# Patient Record
Sex: Female | Born: 1950 | Race: White | Hispanic: No
Health system: Southern US, Community
[De-identification: ages and names within clinical notes are randomized; demographics above are authoritative.]

---

## 2000-09-09 ENCOUNTER — Encounter: Payer: Self-pay | Admitting: Orthopaedic Surgery

## 2000-09-09 ENCOUNTER — Ambulatory Visit (HOSPITAL_COMMUNITY): Admission: RE | Admit: 2000-09-09 | Discharge: 2000-09-09 | Payer: Self-pay | Admitting: Orthopaedic Surgery

## 2000-11-22 ENCOUNTER — Encounter: Payer: Self-pay | Admitting: Orthopaedic Surgery

## 2000-11-28 ENCOUNTER — Encounter: Payer: Self-pay | Admitting: Orthopaedic Surgery

## 2000-11-28 ENCOUNTER — Inpatient Hospital Stay (HOSPITAL_COMMUNITY): Admission: RE | Admit: 2000-11-28 | Discharge: 2000-12-02 | Payer: Self-pay | Admitting: Orthopaedic Surgery

## 2004-01-20 ENCOUNTER — Ambulatory Visit: Payer: Self-pay | Admitting: Family Medicine

## 2004-05-22 ENCOUNTER — Ambulatory Visit (HOSPITAL_COMMUNITY): Admission: RE | Admit: 2004-05-22 | Discharge: 2004-05-22 | Payer: Self-pay | Admitting: Orthopaedic Surgery

## 2005-01-25 ENCOUNTER — Ambulatory Visit: Payer: Self-pay | Admitting: General Practice

## 2005-03-15 ENCOUNTER — Encounter: Admission: RE | Admit: 2005-03-15 | Discharge: 2005-03-15 | Payer: Self-pay | Admitting: Orthopaedic Surgery

## 2005-05-05 ENCOUNTER — Inpatient Hospital Stay (HOSPITAL_COMMUNITY): Admission: RE | Admit: 2005-05-05 | Discharge: 2005-05-09 | Payer: Self-pay | Admitting: Orthopaedic Surgery

## 2007-08-02 IMAGING — CR DG HIP (WITH OR WITHOUT PELVIS) 2-3V*L*
2 series · 2 of 2 positions shown · non-contrast
Comparison: None.

CLINICAL DATA: Left total hip arthroplasty yesterday.

PORTABLE LEFT HIP - 2 VIEW  [DATE]/2330 1115 hours:

[view not recorded (1 of 2)]
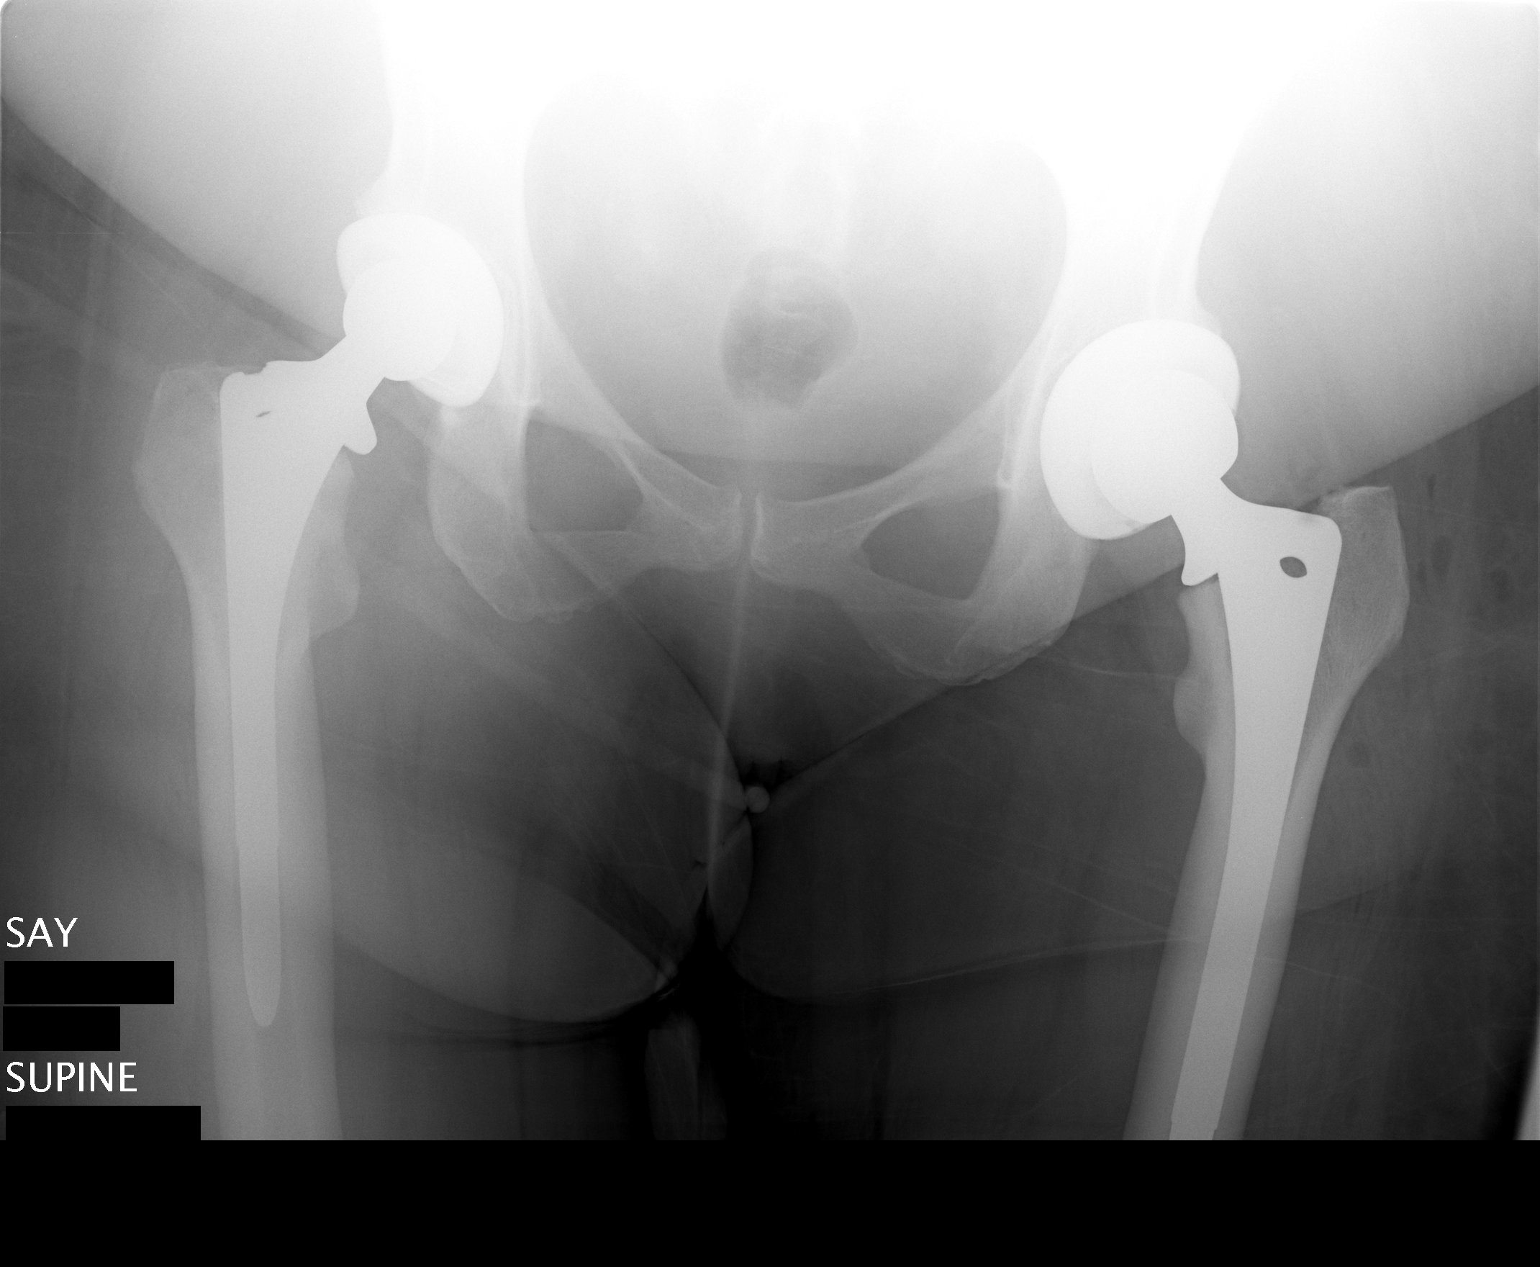

[view not recorded (2 of 2)]
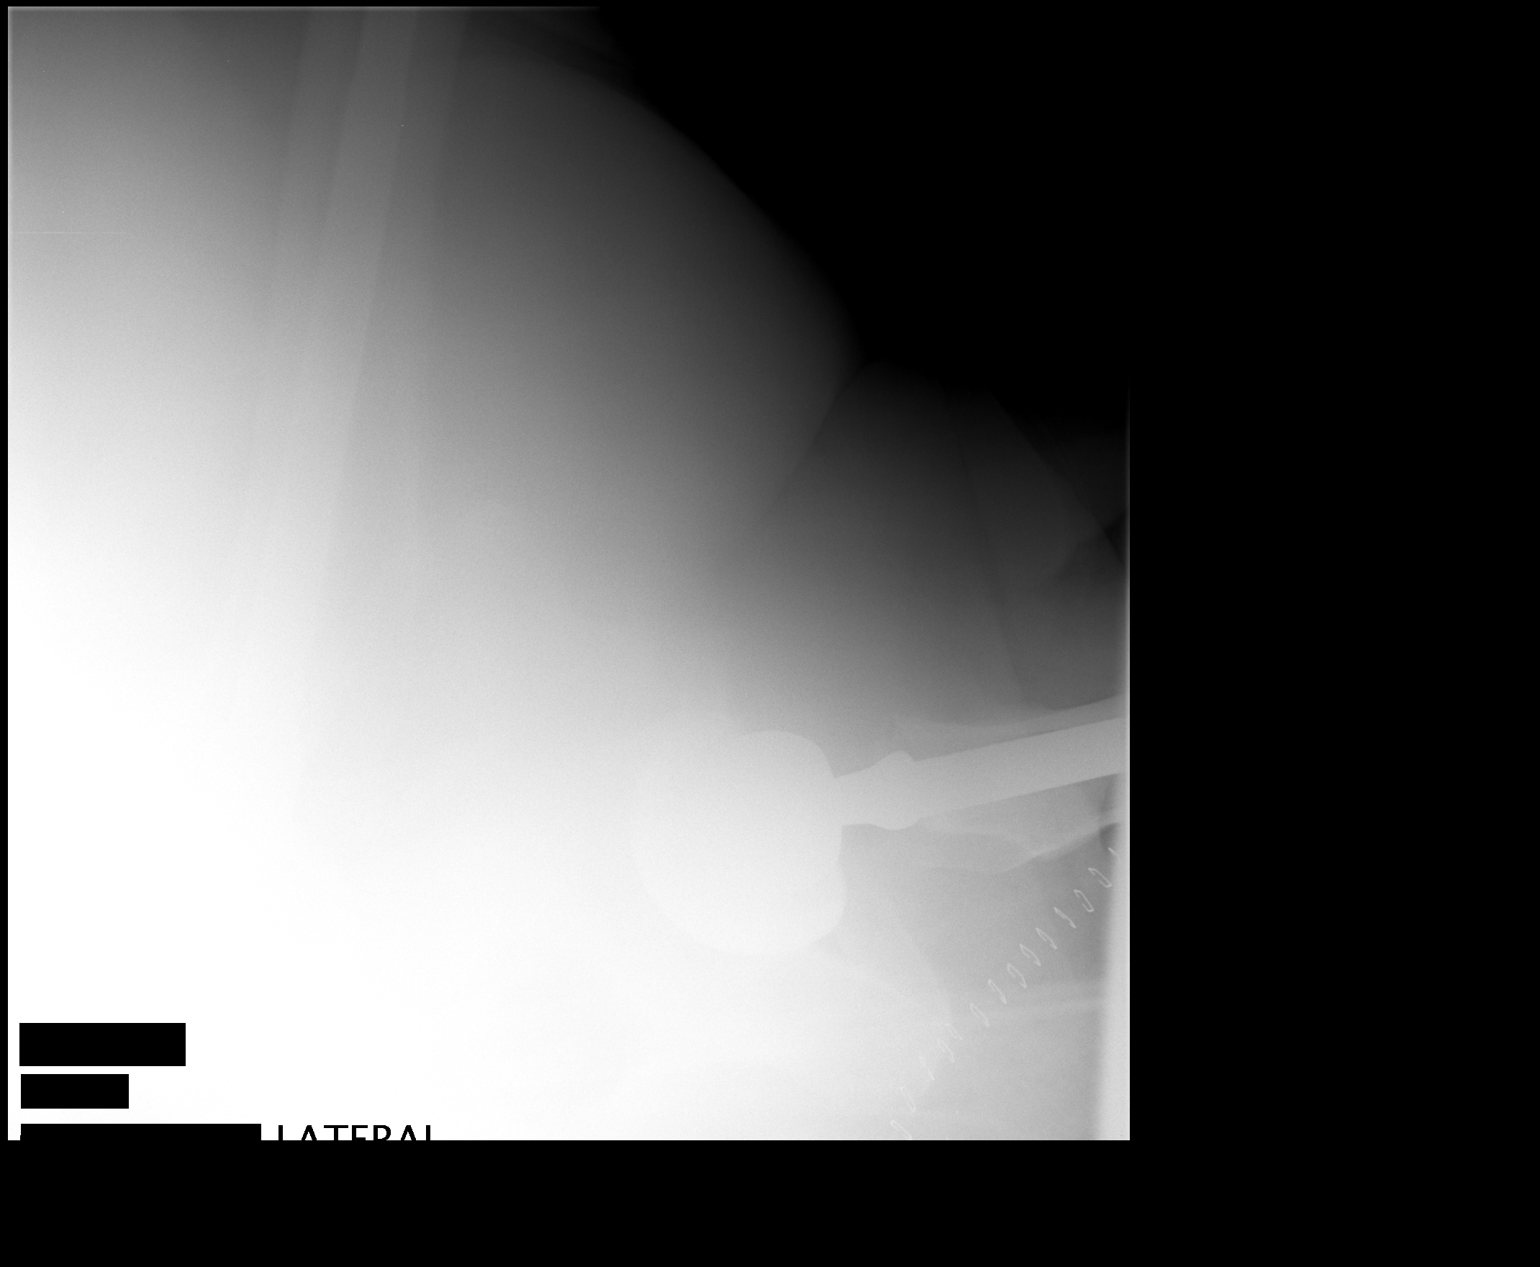

[2 of 2 positions shown; findings below may reference images not displayed]

FINDINGS: The left total hip arthroplasty appears anatomically aligned. No
acute complicating features are noted. Gas is present in the soft tissues as
expected.

The included AP pelvis shows the right hip arthroplasty to be anatomically
aligned.
IMPRESSION: Anatomic alignment after left total hip arthroplasty without acute complicating
features.

## 2011-03-11 ENCOUNTER — Ambulatory Visit: Payer: Self-pay | Admitting: Family Medicine

## 2012-03-23 ENCOUNTER — Ambulatory Visit: Payer: Self-pay | Admitting: Family Medicine

## 2015-06-26 DIAGNOSIS — G894 Chronic pain syndrome: Secondary | ICD-10-CM | POA: Diagnosis not present

## 2015-06-26 DIAGNOSIS — Z79899 Other long term (current) drug therapy: Secondary | ICD-10-CM | POA: Diagnosis not present

## 2015-07-19 DIAGNOSIS — S63502A Unspecified sprain of left wrist, initial encounter: Secondary | ICD-10-CM | POA: Diagnosis not present

## 2015-07-19 DIAGNOSIS — M19032 Primary osteoarthritis, left wrist: Secondary | ICD-10-CM | POA: Diagnosis not present

## 2015-07-19 DIAGNOSIS — Y92512 Supermarket, store or market as the place of occurrence of the external cause: Secondary | ICD-10-CM | POA: Diagnosis not present

## 2015-07-19 DIAGNOSIS — M25532 Pain in left wrist: Secondary | ICD-10-CM | POA: Diagnosis not present

## 2015-07-19 DIAGNOSIS — Z8739 Personal history of other diseases of the musculoskeletal system and connective tissue: Secondary | ICD-10-CM | POA: Diagnosis not present

## 2015-07-19 DIAGNOSIS — M19042 Primary osteoarthritis, left hand: Secondary | ICD-10-CM | POA: Diagnosis not present

## 2015-07-19 DIAGNOSIS — W010XXA Fall on same level from slipping, tripping and stumbling without subsequent striking against object, initial encounter: Secondary | ICD-10-CM | POA: Diagnosis not present

## 2015-07-20 DIAGNOSIS — S63502A Unspecified sprain of left wrist, initial encounter: Secondary | ICD-10-CM | POA: Diagnosis not present

## 2015-08-07 DIAGNOSIS — S63502A Unspecified sprain of left wrist, initial encounter: Secondary | ICD-10-CM | POA: Diagnosis not present

## 2015-08-31 DIAGNOSIS — S46812A Strain of other muscles, fascia and tendons at shoulder and upper arm level, left arm, initial encounter: Secondary | ICD-10-CM | POA: Diagnosis not present

## 2015-09-16 DIAGNOSIS — M5412 Radiculopathy, cervical region: Secondary | ICD-10-CM | POA: Diagnosis not present

## 2015-09-16 DIAGNOSIS — G894 Chronic pain syndrome: Secondary | ICD-10-CM | POA: Diagnosis not present

## 2015-09-16 DIAGNOSIS — Z79899 Other long term (current) drug therapy: Secondary | ICD-10-CM | POA: Diagnosis not present

## 2015-09-16 DIAGNOSIS — M810 Age-related osteoporosis without current pathological fracture: Secondary | ICD-10-CM | POA: Diagnosis not present

## 2015-09-16 DIAGNOSIS — M542 Cervicalgia: Secondary | ICD-10-CM | POA: Diagnosis not present

## 2015-09-22 DIAGNOSIS — M5020 Other cervical disc displacement, unspecified cervical region: Secondary | ICD-10-CM | POA: Diagnosis not present

## 2015-09-22 DIAGNOSIS — M5412 Radiculopathy, cervical region: Secondary | ICD-10-CM | POA: Diagnosis not present

## 2015-09-22 DIAGNOSIS — M47812 Spondylosis without myelopathy or radiculopathy, cervical region: Secondary | ICD-10-CM | POA: Diagnosis not present

## 2015-09-22 DIAGNOSIS — M5021 Other cervical disc displacement,  high cervical region: Secondary | ICD-10-CM | POA: Diagnosis not present

## 2015-09-22 DIAGNOSIS — M50221 Other cervical disc displacement at C4-C5 level: Secondary | ICD-10-CM | POA: Diagnosis not present

## 2015-09-23 DIAGNOSIS — M5412 Radiculopathy, cervical region: Secondary | ICD-10-CM | POA: Diagnosis not present

## 2015-09-23 DIAGNOSIS — M5020 Other cervical disc displacement, unspecified cervical region: Secondary | ICD-10-CM | POA: Diagnosis not present

## 2015-10-14 DIAGNOSIS — M5012 Mid-cervical disc disorder, unspecified level: Secondary | ICD-10-CM | POA: Diagnosis not present

## 2015-11-24 DIAGNOSIS — M150 Primary generalized (osteo)arthritis: Secondary | ICD-10-CM | POA: Diagnosis not present

## 2015-11-24 DIAGNOSIS — G894 Chronic pain syndrome: Secondary | ICD-10-CM | POA: Diagnosis not present

## 2015-11-24 DIAGNOSIS — Z79899 Other long term (current) drug therapy: Secondary | ICD-10-CM | POA: Diagnosis not present

## 2016-01-05 DIAGNOSIS — Z23 Encounter for immunization: Secondary | ICD-10-CM | POA: Diagnosis not present

## 2016-02-25 DIAGNOSIS — E039 Hypothyroidism, unspecified: Secondary | ICD-10-CM | POA: Diagnosis not present

## 2016-02-25 DIAGNOSIS — G8929 Other chronic pain: Secondary | ICD-10-CM | POA: Diagnosis not present

## 2016-02-25 DIAGNOSIS — G894 Chronic pain syndrome: Secondary | ICD-10-CM | POA: Diagnosis not present

## 2016-02-25 DIAGNOSIS — M150 Primary generalized (osteo)arthritis: Secondary | ICD-10-CM | POA: Diagnosis not present

## 2016-02-25 DIAGNOSIS — Z791 Long term (current) use of non-steroidal anti-inflammatories (NSAID): Secondary | ICD-10-CM | POA: Diagnosis not present

## 2016-02-25 DIAGNOSIS — M545 Low back pain: Secondary | ICD-10-CM | POA: Diagnosis not present

## 2016-02-25 DIAGNOSIS — M1710 Unilateral primary osteoarthritis, unspecified knee: Secondary | ICD-10-CM | POA: Diagnosis not present

## 2016-02-25 DIAGNOSIS — Z79899 Other long term (current) drug therapy: Secondary | ICD-10-CM | POA: Diagnosis not present

## 2016-04-25 DIAGNOSIS — J0100 Acute maxillary sinusitis, unspecified: Secondary | ICD-10-CM | POA: Diagnosis not present

## 2016-04-25 DIAGNOSIS — J209 Acute bronchitis, unspecified: Secondary | ICD-10-CM | POA: Diagnosis not present

## 2016-05-12 DIAGNOSIS — E7800 Pure hypercholesterolemia, unspecified: Secondary | ICD-10-CM | POA: Diagnosis not present

## 2016-05-12 DIAGNOSIS — Z6841 Body Mass Index (BMI) 40.0 and over, adult: Secondary | ICD-10-CM | POA: Diagnosis not present

## 2016-05-12 DIAGNOSIS — Z23 Encounter for immunization: Secondary | ICD-10-CM | POA: Diagnosis not present

## 2016-05-12 DIAGNOSIS — I1310 Hypertensive heart and chronic kidney disease without heart failure, with stage 1 through stage 4 chronic kidney disease, or unspecified chronic kidney disease: Secondary | ICD-10-CM | POA: Diagnosis not present

## 2016-05-12 DIAGNOSIS — Z0000 Encounter for general adult medical examination without abnormal findings: Secondary | ICD-10-CM | POA: Diagnosis not present

## 2016-06-23 DIAGNOSIS — Z79899 Other long term (current) drug therapy: Secondary | ICD-10-CM | POA: Diagnosis not present

## 2016-06-23 DIAGNOSIS — M150 Primary generalized (osteo)arthritis: Secondary | ICD-10-CM | POA: Diagnosis not present

## 2016-06-23 DIAGNOSIS — G894 Chronic pain syndrome: Secondary | ICD-10-CM | POA: Diagnosis not present

## 2016-08-23 DIAGNOSIS — G8929 Other chronic pain: Secondary | ICD-10-CM | POA: Diagnosis not present

## 2016-08-23 DIAGNOSIS — M545 Low back pain: Secondary | ICD-10-CM | POA: Diagnosis not present

## 2016-08-23 DIAGNOSIS — N183 Chronic kidney disease, stage 3 (moderate): Secondary | ICD-10-CM | POA: Diagnosis not present

## 2016-08-23 DIAGNOSIS — M150 Primary generalized (osteo)arthritis: Secondary | ICD-10-CM | POA: Diagnosis not present

## 2016-08-23 DIAGNOSIS — G894 Chronic pain syndrome: Secondary | ICD-10-CM | POA: Diagnosis not present

## 2016-08-23 DIAGNOSIS — Z79899 Other long term (current) drug therapy: Secondary | ICD-10-CM | POA: Diagnosis not present

## 2016-10-17 DIAGNOSIS — G894 Chronic pain syndrome: Secondary | ICD-10-CM | POA: Diagnosis not present

## 2016-10-17 DIAGNOSIS — M545 Low back pain: Secondary | ICD-10-CM | POA: Diagnosis not present

## 2016-10-17 DIAGNOSIS — M150 Primary generalized (osteo)arthritis: Secondary | ICD-10-CM | POA: Diagnosis not present

## 2016-10-17 DIAGNOSIS — Z79899 Other long term (current) drug therapy: Secondary | ICD-10-CM | POA: Diagnosis not present

## 2016-10-17 DIAGNOSIS — G8929 Other chronic pain: Secondary | ICD-10-CM | POA: Diagnosis not present

## 2016-10-17 DIAGNOSIS — N183 Chronic kidney disease, stage 3 (moderate): Secondary | ICD-10-CM | POA: Diagnosis not present

## 2016-12-21 DIAGNOSIS — H5202 Hypermetropia, left eye: Secondary | ICD-10-CM | POA: Diagnosis not present

## 2017-01-17 DIAGNOSIS — G894 Chronic pain syndrome: Secondary | ICD-10-CM | POA: Diagnosis not present

## 2017-01-17 DIAGNOSIS — M1710 Unilateral primary osteoarthritis, unspecified knee: Secondary | ICD-10-CM | POA: Diagnosis not present

## 2017-01-17 DIAGNOSIS — Z23 Encounter for immunization: Secondary | ICD-10-CM | POA: Diagnosis not present

## 2017-01-17 DIAGNOSIS — I1310 Hypertensive heart and chronic kidney disease without heart failure, with stage 1 through stage 4 chronic kidney disease, or unspecified chronic kidney disease: Secondary | ICD-10-CM | POA: Diagnosis not present

## 2017-01-17 DIAGNOSIS — N182 Chronic kidney disease, stage 2 (mild): Secondary | ICD-10-CM | POA: Diagnosis not present

## 2017-03-07 DIAGNOSIS — H40053 Ocular hypertension, bilateral: Secondary | ICD-10-CM | POA: Diagnosis not present

## 2017-03-07 DIAGNOSIS — H2513 Age-related nuclear cataract, bilateral: Secondary | ICD-10-CM | POA: Diagnosis not present

## 2017-03-07 DIAGNOSIS — H43812 Vitreous degeneration, left eye: Secondary | ICD-10-CM | POA: Diagnosis not present

## 2017-04-20 DIAGNOSIS — G894 Chronic pain syndrome: Secondary | ICD-10-CM | POA: Diagnosis not present

## 2017-04-20 DIAGNOSIS — Z79899 Other long term (current) drug therapy: Secondary | ICD-10-CM | POA: Diagnosis not present

## 2017-04-20 DIAGNOSIS — I1310 Hypertensive heart and chronic kidney disease without heart failure, with stage 1 through stage 4 chronic kidney disease, or unspecified chronic kidney disease: Secondary | ICD-10-CM | POA: Diagnosis not present

## 2017-04-20 DIAGNOSIS — N182 Chronic kidney disease, stage 2 (mild): Secondary | ICD-10-CM | POA: Diagnosis not present

## 2017-04-21 DIAGNOSIS — E7800 Pure hypercholesterolemia, unspecified: Secondary | ICD-10-CM | POA: Diagnosis not present

## 2017-04-21 DIAGNOSIS — N182 Chronic kidney disease, stage 2 (mild): Secondary | ICD-10-CM | POA: Diagnosis not present

## 2017-04-21 DIAGNOSIS — E039 Hypothyroidism, unspecified: Secondary | ICD-10-CM | POA: Diagnosis not present

## 2017-04-21 DIAGNOSIS — Z79899 Other long term (current) drug therapy: Secondary | ICD-10-CM | POA: Diagnosis not present

## 2017-06-21 DIAGNOSIS — Z0000 Encounter for general adult medical examination without abnormal findings: Secondary | ICD-10-CM | POA: Diagnosis not present

## 2017-07-10 DIAGNOSIS — N644 Mastodynia: Secondary | ICD-10-CM | POA: Diagnosis not present

## 2017-07-10 DIAGNOSIS — N63 Unspecified lump in unspecified breast: Secondary | ICD-10-CM | POA: Diagnosis not present

## 2017-07-13 DIAGNOSIS — N6489 Other specified disorders of breast: Secondary | ICD-10-CM | POA: Diagnosis not present

## 2017-07-13 DIAGNOSIS — R928 Other abnormal and inconclusive findings on diagnostic imaging of breast: Secondary | ICD-10-CM | POA: Diagnosis not present

## 2017-11-10 DIAGNOSIS — I1310 Hypertensive heart and chronic kidney disease without heart failure, with stage 1 through stage 4 chronic kidney disease, or unspecified chronic kidney disease: Secondary | ICD-10-CM | POA: Diagnosis not present

## 2017-11-10 DIAGNOSIS — Z79899 Other long term (current) drug therapy: Secondary | ICD-10-CM | POA: Diagnosis not present

## 2017-11-10 DIAGNOSIS — M150 Primary generalized (osteo)arthritis: Secondary | ICD-10-CM | POA: Diagnosis not present

## 2017-11-10 DIAGNOSIS — N182 Chronic kidney disease, stage 2 (mild): Secondary | ICD-10-CM | POA: Diagnosis not present

## 2017-11-10 DIAGNOSIS — Z6841 Body Mass Index (BMI) 40.0 and over, adult: Secondary | ICD-10-CM | POA: Diagnosis not present

## 2018-01-17 DIAGNOSIS — Z23 Encounter for immunization: Secondary | ICD-10-CM | POA: Diagnosis not present

## 2018-02-02 DIAGNOSIS — R079 Chest pain, unspecified: Secondary | ICD-10-CM | POA: Diagnosis not present

## 2018-02-02 DIAGNOSIS — S42002A Fracture of unspecified part of left clavicle, initial encounter for closed fracture: Secondary | ICD-10-CM | POA: Diagnosis not present

## 2018-02-02 DIAGNOSIS — S42032A Displaced fracture of lateral end of left clavicle, initial encounter for closed fracture: Secondary | ICD-10-CM | POA: Diagnosis not present

## 2018-05-10 DIAGNOSIS — I1310 Hypertensive heart and chronic kidney disease without heart failure, with stage 1 through stage 4 chronic kidney disease, or unspecified chronic kidney disease: Secondary | ICD-10-CM | POA: Diagnosis not present

## 2018-05-10 DIAGNOSIS — N182 Chronic kidney disease, stage 2 (mild): Secondary | ICD-10-CM | POA: Diagnosis not present

## 2018-05-10 DIAGNOSIS — E039 Hypothyroidism, unspecified: Secondary | ICD-10-CM | POA: Diagnosis not present

## 2018-05-10 DIAGNOSIS — Z6841 Body Mass Index (BMI) 40.0 and over, adult: Secondary | ICD-10-CM | POA: Diagnosis not present

## 2018-05-10 DIAGNOSIS — F325 Major depressive disorder, single episode, in full remission: Secondary | ICD-10-CM | POA: Diagnosis not present

## 2018-05-10 DIAGNOSIS — M545 Low back pain: Secondary | ICD-10-CM | POA: Diagnosis not present

## 2018-05-10 DIAGNOSIS — Z79899 Other long term (current) drug therapy: Secondary | ICD-10-CM | POA: Diagnosis not present

## 2018-05-10 DIAGNOSIS — E7800 Pure hypercholesterolemia, unspecified: Secondary | ICD-10-CM | POA: Diagnosis not present

## 2018-05-10 DIAGNOSIS — M5412 Radiculopathy, cervical region: Secondary | ICD-10-CM | POA: Diagnosis not present

## 2018-05-10 DIAGNOSIS — G8929 Other chronic pain: Secondary | ICD-10-CM | POA: Diagnosis not present

## 2018-07-30 DIAGNOSIS — L723 Sebaceous cyst: Secondary | ICD-10-CM | POA: Diagnosis not present

## 2018-07-31 DIAGNOSIS — L02212 Cutaneous abscess of back [any part, except buttock]: Secondary | ICD-10-CM | POA: Diagnosis not present

## 2018-07-31 DIAGNOSIS — L723 Sebaceous cyst: Secondary | ICD-10-CM | POA: Diagnosis not present

## 2018-07-31 DIAGNOSIS — Z6841 Body Mass Index (BMI) 40.0 and over, adult: Secondary | ICD-10-CM | POA: Diagnosis not present

## 2018-08-14 DIAGNOSIS — L02212 Cutaneous abscess of back [any part, except buttock]: Secondary | ICD-10-CM | POA: Diagnosis not present

## 2018-08-14 DIAGNOSIS — L723 Sebaceous cyst: Secondary | ICD-10-CM | POA: Diagnosis not present

## 2018-10-31 DIAGNOSIS — H43813 Vitreous degeneration, bilateral: Secondary | ICD-10-CM | POA: Diagnosis not present

## 2018-10-31 DIAGNOSIS — H353131 Nonexudative age-related macular degeneration, bilateral, early dry stage: Secondary | ICD-10-CM | POA: Diagnosis not present

## 2018-11-08 DIAGNOSIS — N182 Chronic kidney disease, stage 2 (mild): Secondary | ICD-10-CM | POA: Diagnosis not present

## 2018-11-08 DIAGNOSIS — I1310 Hypertensive heart and chronic kidney disease without heart failure, with stage 1 through stage 4 chronic kidney disease, or unspecified chronic kidney disease: Secondary | ICD-10-CM | POA: Diagnosis not present

## 2018-11-08 DIAGNOSIS — Z0000 Encounter for general adult medical examination without abnormal findings: Secondary | ICD-10-CM | POA: Diagnosis not present

## 2018-11-08 DIAGNOSIS — Z1239 Encounter for other screening for malignant neoplasm of breast: Secondary | ICD-10-CM | POA: Diagnosis not present

## 2018-11-08 DIAGNOSIS — G8929 Other chronic pain: Secondary | ICD-10-CM | POA: Diagnosis not present

## 2018-11-08 DIAGNOSIS — M545 Low back pain: Secondary | ICD-10-CM | POA: Diagnosis not present

## 2018-11-08 DIAGNOSIS — Z6839 Body mass index (BMI) 39.0-39.9, adult: Secondary | ICD-10-CM | POA: Diagnosis not present

## 2018-11-08 DIAGNOSIS — Z1211 Encounter for screening for malignant neoplasm of colon: Secondary | ICD-10-CM | POA: Diagnosis not present

## 2018-11-08 DIAGNOSIS — F325 Major depressive disorder, single episode, in full remission: Secondary | ICD-10-CM | POA: Diagnosis not present

## 2018-11-14 DIAGNOSIS — N182 Chronic kidney disease, stage 2 (mild): Secondary | ICD-10-CM | POA: Diagnosis not present

## 2018-11-16 DIAGNOSIS — Z20828 Contact with and (suspected) exposure to other viral communicable diseases: Secondary | ICD-10-CM | POA: Diagnosis not present

## 2018-12-06 DIAGNOSIS — H04123 Dry eye syndrome of bilateral lacrimal glands: Secondary | ICD-10-CM | POA: Diagnosis not present

## 2018-12-06 DIAGNOSIS — H52213 Irregular astigmatism, bilateral: Secondary | ICD-10-CM | POA: Diagnosis not present

## 2018-12-06 DIAGNOSIS — H2513 Age-related nuclear cataract, bilateral: Secondary | ICD-10-CM | POA: Diagnosis not present

## 2019-01-23 DIAGNOSIS — Z1231 Encounter for screening mammogram for malignant neoplasm of breast: Secondary | ICD-10-CM | POA: Diagnosis not present

## 2019-05-14 DIAGNOSIS — Z79899 Other long term (current) drug therapy: Secondary | ICD-10-CM | POA: Diagnosis not present

## 2019-05-14 DIAGNOSIS — N1831 Chronic kidney disease, stage 3a: Secondary | ICD-10-CM | POA: Diagnosis not present

## 2019-05-14 DIAGNOSIS — I1310 Hypertensive heart and chronic kidney disease without heart failure, with stage 1 through stage 4 chronic kidney disease, or unspecified chronic kidney disease: Secondary | ICD-10-CM | POA: Diagnosis not present

## 2019-05-14 DIAGNOSIS — F325 Major depressive disorder, single episode, in full remission: Secondary | ICD-10-CM | POA: Diagnosis not present

## 2019-05-14 DIAGNOSIS — G8929 Other chronic pain: Secondary | ICD-10-CM | POA: Diagnosis not present

## 2019-05-14 DIAGNOSIS — E039 Hypothyroidism, unspecified: Secondary | ICD-10-CM | POA: Diagnosis not present

## 2019-05-14 DIAGNOSIS — M545 Low back pain: Secondary | ICD-10-CM | POA: Diagnosis not present

## 2019-05-14 DIAGNOSIS — M8949 Other hypertrophic osteoarthropathy, multiple sites: Secondary | ICD-10-CM | POA: Diagnosis not present

## 2019-05-14 DIAGNOSIS — I129 Hypertensive chronic kidney disease with stage 1 through stage 4 chronic kidney disease, or unspecified chronic kidney disease: Secondary | ICD-10-CM | POA: Diagnosis not present

## 2019-05-14 DIAGNOSIS — E7800 Pure hypercholesterolemia, unspecified: Secondary | ICD-10-CM | POA: Diagnosis not present

## 2019-05-14 DIAGNOSIS — N183 Chronic kidney disease, stage 3 unspecified: Secondary | ICD-10-CM | POA: Diagnosis not present

## 2019-12-03 DIAGNOSIS — N1831 Chronic kidney disease, stage 3a: Secondary | ICD-10-CM | POA: Diagnosis not present

## 2019-12-03 DIAGNOSIS — Z0000 Encounter for general adult medical examination without abnormal findings: Secondary | ICD-10-CM | POA: Diagnosis not present

## 2019-12-03 DIAGNOSIS — M545 Low back pain: Secondary | ICD-10-CM | POA: Diagnosis not present

## 2019-12-03 DIAGNOSIS — G8929 Other chronic pain: Secondary | ICD-10-CM | POA: Diagnosis not present

## 2019-12-03 DIAGNOSIS — I129 Hypertensive chronic kidney disease with stage 1 through stage 4 chronic kidney disease, or unspecified chronic kidney disease: Secondary | ICD-10-CM | POA: Diagnosis not present

## 2019-12-03 DIAGNOSIS — F325 Major depressive disorder, single episode, in full remission: Secondary | ICD-10-CM | POA: Diagnosis not present

## 2019-12-03 DIAGNOSIS — M797 Fibromyalgia: Secondary | ICD-10-CM | POA: Diagnosis not present

## 2019-12-03 DIAGNOSIS — G894 Chronic pain syndrome: Secondary | ICD-10-CM | POA: Diagnosis not present

## 2019-12-03 DIAGNOSIS — E039 Hypothyroidism, unspecified: Secondary | ICD-10-CM | POA: Diagnosis not present

## 2019-12-27 ENCOUNTER — Other Ambulatory Visit: Payer: Self-pay | Admitting: Hospice and Palliative Medicine

## 2019-12-27 DIAGNOSIS — Z20828 Contact with and (suspected) exposure to other viral communicable diseases: Secondary | ICD-10-CM | POA: Diagnosis not present

## 2019-12-27 DIAGNOSIS — U071 COVID-19: Secondary | ICD-10-CM

## 2019-12-27 DIAGNOSIS — R0981 Nasal congestion: Secondary | ICD-10-CM | POA: Diagnosis not present

## 2019-12-27 NOTE — Progress Notes (Signed)
I connected by phone with patient on 12/27/2019 to discuss the potential use of an new treatment for mild to moderate COVID-19 viral infection in non-hospitalized patients.   This patient is a age/sex that meets the FDA criteria for Emergency Use Authorization of casirivimab\imdevimab.  Has a (+) direct SARS-CoV-2 viral test result 1. Has mild or moderate COVID-19  2. Is ? 70 years of age and weighs ? 40 kg 3. Is NOT hospitalized due to COVID-19 4. Is NOT requiring oxygen therapy or requiring an increase in baseline oxygen flow rate due to COVID-19 5. Is within 10 days of symptom onset 6. Has at least one of the high risk factor(s) for progression to severe COVID-19 and/or hospitalization as defined in EUA. ? Specific high risk criteria : age   Symptom onset 9/7   I have spoken and communicated the following to the patient or parent/caregiver:   1. FDA has authorized the emergency use of casirivimab\imdevimab for the treatment of mild to moderate COVID-19 in adults and pediatric patients with positive results of direct SARS-CoV-2 viral testing who are 31 years of age and older weighing at least 40 kg, and who are at high risk for progressing to severe COVID-19 and/or hospitalization.   2. The significant known and potential risks and benefits of casirivimab\imdevimab, and the extent to which such potential risks and benefits are unknown.   3. Information on available alternative treatments and the risks and benefits of those alternatives, including clinical trials.   4. Patients treated with casirivimab\imdevimab should continue to self-isolate and use infection control measures (e.g., wear mask, isolate, social distance, avoid sharing personal items, clean and disinfect high touch surfaces, and frequent handwashing) according to CDC guidelines.    5. The patient or parent/caregiver has the option to accept or refuse casirivimab\imdevimab .   After reviewing this information with the  patient, The patient agreed to proceed with receiving casirivimab\imdevimab infusion and will be provided a copy of the Fact sheet prior to receiving the infusion.Laurette Schimke, PhD, NP-C [phone] (Infusion Center Hotline)

## 2019-12-28 ENCOUNTER — Telehealth: Payer: Self-pay | Admitting: Oncology

## 2019-12-28 ENCOUNTER — Ambulatory Visit (HOSPITAL_COMMUNITY)
Admission: RE | Admit: 2019-12-28 | Discharge: 2019-12-28 | Disposition: A | Discharge status: Home / Self Care | Payer: Medicare Other | Source: Ambulatory Visit | Attending: Pulmonary Disease | Admitting: Pulmonary Disease

## 2019-12-28 ENCOUNTER — Other Ambulatory Visit (HOSPITAL_COMMUNITY): Payer: Self-pay

## 2019-12-28 DIAGNOSIS — Z23 Encounter for immunization: Secondary | ICD-10-CM | POA: Insufficient documentation

## 2019-12-28 DIAGNOSIS — U071 COVID-19: Secondary | ICD-10-CM | POA: Diagnosis present

## 2019-12-28 MED ORDER — DIPHENHYDRAMINE HCL 50 MG/ML IJ SOLN: 50.0000 mg | Freq: Once | INTRAMUSCULAR | Status: DC | PRN

## 2019-12-28 MED ORDER — ALBUTEROL SULFATE HFA 108 (90 BASE) MCG/ACT IN AERS: 2.0000 | INHALATION_SPRAY | Freq: Once | RESPIRATORY_TRACT | Status: DC | PRN

## 2019-12-28 MED ORDER — SODIUM CHLORIDE 0.9 % IV SOLN: INTRAVENOUS | Status: DC | PRN

## 2019-12-28 MED ORDER — FAMOTIDINE IN NACL 20-0.9 MG/50ML-% IV SOLN: 20.0000 mg | Freq: Once | INTRAVENOUS | Status: DC | PRN

## 2019-12-28 MED ORDER — EPINEPHRINE 0.3 MG/0.3ML IJ SOAJ: 0.3000 mg | Freq: Once | INTRAMUSCULAR | Status: DC | PRN

## 2019-12-28 MED ORDER — CASIRIVIMAB-IMDEVIMAB 600-600 MG/10ML IJ SOLN
1200.0000 mg | Freq: Once | INTRAMUSCULAR | Status: AC
  Administered 2019-12-28: 1200 mg via INTRAVENOUS
  Filled 2019-12-28: qty 10

## 2019-12-28 MED ORDER — METHYLPREDNISOLONE SODIUM SUCC 125 MG IJ SOLR: 125.0000 mg | Freq: Once | INTRAMUSCULAR | Status: DC | PRN

## 2019-12-28 NOTE — Progress Notes (Signed)
°  Diagnosis: COVID-19 ° °Physician: Dr. Wright ° °Procedure: [facility] Med: casirivimab\imdevimab infusion - Provided patient with casirivimab\imdevimab fact sheet for patients, parents and caregivers prior to infusion. ° °Complications: No immediate complications noted. ° °Discharge: Discharged home  ° °Tigran Haynie E Harsimran Westman °12/28/2019 ° ° °

## 2019-12-28 NOTE — Telephone Encounter (Signed)
Patient already called and screened.   Durenda Hurt, NP 12/28/2019 3:07 PM

## 2019-12-28 NOTE — Discharge Instructions (Signed)

## 2020-01-08 DIAGNOSIS — Z20828 Contact with and (suspected) exposure to other viral communicable diseases: Secondary | ICD-10-CM | POA: Diagnosis not present

## 2020-01-08 DIAGNOSIS — R05 Cough: Secondary | ICD-10-CM | POA: Diagnosis not present
